# Patient Record
Sex: Female | Born: 1945
Health system: Southern US, Community
[De-identification: ages and names within clinical notes are randomized; demographics above are authoritative.]

## PROBLEM LIST (undated history)

## (undated) DIAGNOSIS — R000 Tachycardia, unspecified: Secondary | ICD-10-CM

## (undated) DIAGNOSIS — K5792 Diverticulitis of intestine, part unspecified, without perforation or abscess without bleeding: Secondary | ICD-10-CM

## (undated) DIAGNOSIS — I4892 Unspecified atrial flutter: Secondary | ICD-10-CM

## (undated) DIAGNOSIS — M810 Age-related osteoporosis without current pathological fracture: Secondary | ICD-10-CM

## (undated) DIAGNOSIS — H9190 Unspecified hearing loss, unspecified ear: Secondary | ICD-10-CM

## (undated) HISTORY — DX: Unspecified atrial flutter: I48.92

## (undated) HISTORY — DX: Unspecified hearing loss, unspecified ear: H91.90

## (undated) HISTORY — DX: Age-related osteoporosis without current pathological fracture: M81.0

## (undated) HISTORY — PX: TUBAL LIGATION: SHX77

## (undated) HISTORY — DX: Diverticulitis of intestine, part unspecified, without perforation or abscess without bleeding: K57.92

## (undated) HISTORY — PX: COLONOSCOPY: SHX174

## (undated) HISTORY — DX: Tachycardia, unspecified: R00.0

---

## 1980-04-11 HISTORY — PX: COSMETIC SURGERY: SHX468

## 1983-04-12 HISTORY — PX: APPENDECTOMY: SHX54

## 1998-06-23 ENCOUNTER — Other Ambulatory Visit: Admission: RE | Admit: 1998-06-23 | Discharge: 1998-06-23 | Payer: Self-pay | Admitting: Obstetrics & Gynecology

## 1999-09-01 ENCOUNTER — Other Ambulatory Visit: Admission: RE | Admit: 1999-09-01 | Discharge: 1999-09-01 | Payer: Self-pay | Admitting: Obstetrics & Gynecology

## 2000-09-06 ENCOUNTER — Other Ambulatory Visit: Admission: RE | Admit: 2000-09-06 | Discharge: 2000-09-06 | Payer: Self-pay | Admitting: Obstetrics & Gynecology

## 2001-10-23 ENCOUNTER — Other Ambulatory Visit: Admission: RE | Admit: 2001-10-23 | Discharge: 2001-10-23 | Payer: Self-pay | Admitting: Obstetrics & Gynecology

## 2002-10-29 ENCOUNTER — Other Ambulatory Visit: Admission: RE | Admit: 2002-10-29 | Discharge: 2002-10-29 | Payer: Self-pay | Admitting: Obstetrics & Gynecology

## 2003-04-12 DIAGNOSIS — M810 Age-related osteoporosis without current pathological fracture: Secondary | ICD-10-CM

## 2003-04-12 HISTORY — DX: Age-related osteoporosis without current pathological fracture: M81.0

## 2003-11-07 ENCOUNTER — Other Ambulatory Visit: Admission: RE | Admit: 2003-11-07 | Discharge: 2003-11-07 | Payer: Self-pay | Admitting: Obstetrics & Gynecology

## 2004-11-16 ENCOUNTER — Other Ambulatory Visit: Admission: RE | Admit: 2004-11-16 | Discharge: 2004-11-16 | Payer: Self-pay | Admitting: Obstetrics & Gynecology

## 2005-12-21 ENCOUNTER — Ambulatory Visit: Payer: Self-pay | Admitting: Cardiology

## 2005-12-30 ENCOUNTER — Ambulatory Visit: Payer: Self-pay

## 2013-08-05 ENCOUNTER — Ambulatory Visit (HOSPITAL_COMMUNITY)
Admission: RE | Admit: 2013-08-05 | Discharge: 2013-08-05 | Disposition: A | Discharge status: Home / Self Care | Payer: Medicare Other | Source: Ambulatory Visit | Attending: Obstetrics & Gynecology | Admitting: Obstetrics & Gynecology

## 2013-08-05 ENCOUNTER — Other Ambulatory Visit (HOSPITAL_COMMUNITY): Payer: Self-pay | Admitting: Obstetrics & Gynecology

## 2013-08-05 ENCOUNTER — Encounter (INDEPENDENT_AMBULATORY_CARE_PROVIDER_SITE_OTHER): Payer: Self-pay

## 2013-08-05 DIAGNOSIS — R059 Cough, unspecified: Secondary | ICD-10-CM

## 2013-08-05 DIAGNOSIS — R05 Cough: Secondary | ICD-10-CM

## 2013-08-05 DIAGNOSIS — R0989 Other specified symptoms and signs involving the circulatory and respiratory systems: Secondary | ICD-10-CM | POA: Insufficient documentation

## 2014-01-14 ENCOUNTER — Encounter: Payer: Self-pay | Admitting: Family Medicine

## 2014-01-14 DIAGNOSIS — H9190 Unspecified hearing loss, unspecified ear: Secondary | ICD-10-CM | POA: Insufficient documentation

## 2014-01-14 DIAGNOSIS — M810 Age-related osteoporosis without current pathological fracture: Secondary | ICD-10-CM | POA: Insufficient documentation

## 2016-07-01 ENCOUNTER — Other Ambulatory Visit: Payer: Self-pay | Admitting: Gastroenterology

## 2016-08-30 ENCOUNTER — Encounter (HOSPITAL_COMMUNITY): Payer: Self-pay

## 2016-08-30 ENCOUNTER — Ambulatory Visit (HOSPITAL_COMMUNITY): Admit: 2016-08-30 | Payer: Medicare Other | Admitting: Gastroenterology

## 2016-08-30 SURGERY — COLONOSCOPY WITH PROPOFOL
Anesthesia: Monitor Anesthesia Care

## 2017-01-12 ENCOUNTER — Institutional Professional Consult (permissible substitution): Payer: No Typology Code available for payment source | Admitting: Neurology

## 2019-06-06 ENCOUNTER — Ambulatory Visit: Payer: Medicare Other | Attending: Internal Medicine

## 2019-06-06 DIAGNOSIS — Z23 Encounter for immunization: Secondary | ICD-10-CM | POA: Insufficient documentation

## 2019-06-06 NOTE — Progress Notes (Signed)
   Covid-19 Vaccination Clinic  Name:  Meagan Stein    MRN: [id number] DOB: 05/14/1945  06/06/2019  [name] was observed post Covid-19 immunization for 15 minutes without incidence. She was provided with Vaccine Information Sheet and instruction to access the V-Safe system.   [name] was instructed to call 911 with any severe reactions post vaccine: Marland Kitchen Difficulty breathing  . Swelling of your face and throat  . A fast heartbeat  . A bad rash all over your body  . Dizziness and weakness    Immunizations Administered    Name Date Dose VIS Date Route   Pfizer COVID-19 Vaccine 06/06/2019  1:52 PM 0.3 mL 03/22/2019 Intramuscular   Manufacturer: [place]   Lot: [id number]   [place]: [id number]

## 2019-07-03 ENCOUNTER — Ambulatory Visit: Payer: Medicare Other | Attending: Internal Medicine

## 2019-07-03 DIAGNOSIS — Z23 Encounter for immunization: Secondary | ICD-10-CM

## 2019-07-03 NOTE — Progress Notes (Signed)
   Covid-19 Vaccination Clinic  Name:  Meagan Stein    MRN: [id number] DOB: 1945-10-10  07/03/2019  [name] was observed post Covid-19 immunization for 15 minutes without incident. She was provided with Vaccine Information Sheet and instruction to access the V-Safe system.   [name] was instructed to call 911 with any severe reactions post vaccine: Marland Kitchen Difficulty breathing  . Swelling of face and throat  . A fast heartbeat  . A bad rash all over body  . Dizziness and weakness   Immunizations Administered    Name Date Dose VIS Date Route   Pfizer COVID-19 Vaccine 07/03/2019  1:09 PM 0.3 mL 03/22/2019 Intramuscular   Manufacturer: [place]   Lot: [id number]   [place]: [id number]

## 2020-09-17 DIAGNOSIS — D0362 Melanoma in situ of left upper limb, including shoulder: Secondary | ICD-10-CM | POA: Diagnosis not present

## 2020-09-17 DIAGNOSIS — D485 Neoplasm of uncertain behavior of skin: Secondary | ICD-10-CM | POA: Diagnosis not present

## 2020-09-17 DIAGNOSIS — D1801 Hemangioma of skin and subcutaneous tissue: Secondary | ICD-10-CM | POA: Diagnosis not present

## 2020-09-17 DIAGNOSIS — D0361 Melanoma in situ of right upper limb, including shoulder: Secondary | ICD-10-CM | POA: Diagnosis not present

## 2020-10-15 DIAGNOSIS — D0361 Melanoma in situ of right upper limb, including shoulder: Secondary | ICD-10-CM | POA: Diagnosis not present

## 2020-11-05 ENCOUNTER — Emergency Department (HOSPITAL_BASED_OUTPATIENT_CLINIC_OR_DEPARTMENT_OTHER)
Admission: EM | Admit: 2020-11-05 | Discharge: 2020-11-05 | Disposition: A | Discharge status: Home / Self Care | Payer: Medicare HMO | Attending: Emergency Medicine | Admitting: Emergency Medicine

## 2020-11-05 ENCOUNTER — Emergency Department (HOSPITAL_BASED_OUTPATIENT_CLINIC_OR_DEPARTMENT_OTHER): Payer: Medicare HMO

## 2020-11-05 ENCOUNTER — Emergency Department (HOSPITAL_BASED_OUTPATIENT_CLINIC_OR_DEPARTMENT_OTHER): Payer: Medicare HMO | Admitting: Radiology

## 2020-11-05 ENCOUNTER — Encounter (HOSPITAL_BASED_OUTPATIENT_CLINIC_OR_DEPARTMENT_OTHER): Payer: Self-pay | Admitting: Emergency Medicine

## 2020-11-05 ENCOUNTER — Other Ambulatory Visit: Payer: Self-pay

## 2020-11-05 DIAGNOSIS — R140 Abdominal distension (gaseous): Secondary | ICD-10-CM | POA: Insufficient documentation

## 2020-11-05 DIAGNOSIS — R350 Frequency of micturition: Secondary | ICD-10-CM | POA: Diagnosis not present

## 2020-11-05 DIAGNOSIS — R000 Tachycardia, unspecified: Secondary | ICD-10-CM | POA: Insufficient documentation

## 2020-11-05 DIAGNOSIS — I4892 Unspecified atrial flutter: Secondary | ICD-10-CM | POA: Insufficient documentation

## 2020-11-05 DIAGNOSIS — K5792 Diverticulitis of intestine, part unspecified, without perforation or abscess without bleeding: Secondary | ICD-10-CM | POA: Diagnosis not present

## 2020-11-05 DIAGNOSIS — R1030 Lower abdominal pain, unspecified: Secondary | ICD-10-CM | POA: Insufficient documentation

## 2020-11-05 DIAGNOSIS — I471 Supraventricular tachycardia: Secondary | ICD-10-CM | POA: Diagnosis not present

## 2020-11-05 DIAGNOSIS — R109 Unspecified abdominal pain: Secondary | ICD-10-CM | POA: Diagnosis not present

## 2020-11-05 DIAGNOSIS — Z7982 Long term (current) use of aspirin: Secondary | ICD-10-CM | POA: Diagnosis not present

## 2020-11-05 DIAGNOSIS — I480 Paroxysmal atrial fibrillation: Secondary | ICD-10-CM | POA: Diagnosis not present

## 2020-11-05 LAB — URINALYSIS, ROUTINE W REFLEX MICROSCOPIC
Bilirubin Urine: NEGATIVE
Glucose, UA: NEGATIVE mg/dL
Ketones, ur: >80 mg/dL — AB
Leukocytes,Ua: NEGATIVE
Nitrite: NEGATIVE
Protein, ur: 30 mg/dL — AB
Specific Gravity, Urine: 1.033 — ABNORMAL HIGH (ref 1.005–1.030)
pH: 5.5 (ref 5.0–8.0)

## 2020-11-05 LAB — CBC
HCT: 43.4 % (ref 36.0–46.0)
Hemoglobin: 14.1 g/dL (ref 12.0–15.0)
MCH: 29.6 pg (ref 26.0–34.0)
MCHC: 32.5 g/dL (ref 30.0–36.0)
MCV: 91 fL (ref 80.0–100.0)
Platelets: 292 10*3/uL (ref 150–400)
RBC: 4.77 MIL/uL (ref 3.87–5.11)
RDW: 13 % (ref 11.5–15.5)
WBC: 12.2 10*3/uL — ABNORMAL HIGH (ref 4.0–10.5)
nRBC: 0 % (ref 0.0–0.2)

## 2020-11-05 LAB — TSH: TSH: 1.802 u[IU]/mL (ref 0.350–4.500)

## 2020-11-05 LAB — BASIC METABOLIC PANEL
Anion gap: 12 (ref 5–15)
BUN: 17 mg/dL (ref 8–23)
CO2: 23 mmol/L (ref 22–32)
Calcium: 9.2 mg/dL (ref 8.9–10.3)
Chloride: 104 mmol/L (ref 98–111)
Creatinine, Ser: 0.76 mg/dL (ref 0.44–1.00)
GFR, Estimated: >60 mL/min (ref >60)
Glucose, Bld: 97 mg/dL (ref 70–99)
Potassium: 3.9 mmol/L (ref 3.5–5.1)
Sodium: 139 mmol/L (ref 135–145)

## 2020-11-05 LAB — TROPONIN I (HIGH SENSITIVITY)
Troponin I (High Sensitivity): 4 ng/L (ref <18)
Troponin I (High Sensitivity): 5 ng/L (ref <18)

## 2020-11-05 LAB — MAGNESIUM: Magnesium: 2 mg/dL (ref 1.7–2.4)

## 2020-11-05 MED ORDER — IOHEXOL 300 MG/ML  SOLN
75.0000 mL | Freq: Once | INTRAMUSCULAR | Status: AC | PRN
  Administered 2020-11-05: 75 mL via INTRAVENOUS

## 2020-11-05 MED ORDER — METOPROLOL TARTRATE 5 MG/5ML IV SOLN
5.0000 mg | Freq: Once | INTRAVENOUS | Status: DC
Start: 2020-11-05 — End: 2020-11-05
  Filled 2020-11-05: qty 5

## 2020-11-05 MED ORDER — SODIUM CHLORIDE 0.9 % IV SOLN: Freq: Once | INTRAVENOUS | Status: AC

## 2020-11-05 MED ORDER — AMOXICILLIN-POT CLAVULANATE 875-125 MG PO TABS: 1.0000 | ORAL_TABLET | Freq: Two times a day (BID) | ORAL | 0 refills | Status: DC

## 2020-11-05 NOTE — ED Notes (Signed)
Pt transferred to [facility] then back to bed. At this time HR has decreased to 100

## 2020-11-05 NOTE — ED Provider Notes (Signed)
CT scan showed uncomplicated diverticulitis.  Probably the explanation for patient's abdominal pain.  No other acute findings.  Patient did arrive here with rapid heart rate probably atrial flutter.  Patient without any knowledge that the heart rate was fast.  She converted and has been in a sinus rhythm around heart rate around 100 since.  Patient was treated with Augmentin for the diverticulitis.  Patient will be given referral to cardiology for further work-up of the palpitations.  Patient currently nontoxic no acute distress.  No significant arrhythmias.   Fredia Sorrow, MD 11/05/20 [phone]

## 2020-11-05 NOTE — ED Provider Notes (Signed)
[facility] EMERGENCY DEPT Provider Note   CSN: [id number] Arrival date & time: 11/05/20  1139     History No chief complaint on file.   Meagan Stein is a 75 y.o. female.  HPI Patient reports lower abdominal pain for about 2 or 3 days.  She reports she is felt a bloating and distention sensation lower abdomen.  Is been particularly worse after eating on the first day.  She reports is an aching quality.  Starting last night and yesterday if she was having urinary frequency.  She reports she got up multiple times during the night and felt like she had to urinate but could not go.  This morning she has put out small amounts of urine.  It is not burning when she goes.  She went to urgent care for evaluation and there was found to have high heart rate.  Patient was referred to the emergency department for further evaluation.  Patient denies any sensation of fluttering or skipping of her heart.  She does not feel any palpitations.  This morning she was slightly lightheaded but she attributed to her lower abdominal pain.    Past Medical History:  Diagnosis Date   Atrial flutter ([place])    Hearing loss    Osteoporosis 2005    Patient Active Problem List   Diagnosis Date Noted   Osteoporosis    Hearing loss     Past Surgical History:  Procedure Laterality Date   APPENDECTOMY  1985   COSMETIC SURGERY  1982   breast implants   TUBAL LIGATION       OB History   No obstetric history on file.     Family History  Problem Relation Age of Onset   Cancer Sister    Cancer Brother    Cancer Sister     Social History   Tobacco Use   Smoking status: Never   Smokeless tobacco: Never  Substance Use Topics   Alcohol use: No   Drug use: No    Home Medications Prior to Admission medications   Medication Sig Start Date End Date Taking? Authorizing Provider  alendronate (FOSAMAX) 70 MG tablet Take 70 mg by mouth once a week. Take with a full glass of water on an empty  stomach.   Yes [provider]  aspirin 81 MG tablet Take 81 mg by mouth daily.   Yes [provider]  ALPRAZolam (XANAX) 0.25 MG tablet Take 0.25 mg by mouth at bedtime.    [provider]  calcium carbonate (OS-CAL) 600 MG TABS tablet Take 600 mg by mouth daily with breakfast.    [provider]  Multiple Vitamin (MULTIVITAMIN) tablet Take 1 tablet by mouth daily.    [provider]    Allergies    Patient has no known allergies.  Review of Systems   Review of Systems 10 systems reviewed negative except as per HPI Physical Exam Updated Vital Signs BP 111/72   Pulse (!) 140   Temp 98.4 F (36.9 C) (Oral)   Resp 17   Ht '5\' 5"'$  (1.651 m)   Wt 67.1 kg   SpO2 99%   BMI 24.63 kg/m   Physical Exam Constitutional:      Comments: Alert, nontoxic well in appearance.  HENT:     Head: Normocephalic and atraumatic.     Mouth/Throat:     Pharynx: Oropharynx is clear.  Eyes:     Extraocular Movements: Extraocular movements intact.  Cardiovascular:  Comments: Extreme tachycardia. Pulmonary:     Effort: Pulmonary effort is normal.     Breath sounds: Normal breath sounds.  Abdominal:     Comments: Patient has moderate lower abdominal discomfort.  Some distention and fullness of the suprapubic area.  No guarding.  No masses in the femoral region  Musculoskeletal:        General: No swelling or tenderness. Normal range of motion.     Right lower leg: No edema.     Left lower leg: No edema.  Skin:    General: Skin is warm and dry.  Neurological:     General: No focal deficit present.     Mental Status: She is oriented to person, place, and time.     Motor: No weakness.     Coordination: Coordination normal.  Psychiatric:        Mood and Affect: Mood normal.    ED Results / Procedures / Treatments   Labs (all labs ordered are listed, but only abnormal results are displayed) Labs Reviewed  CBC - Abnormal; Notable for the following  components:      Result Value   WBC 12.2 (*)    All other components within normal limits  URINALYSIS, ROUTINE W REFLEX MICROSCOPIC - Abnormal; Notable for the following components:   Specific Gravity, Urine 1.033 (*)    Hgb urine dipstick LARGE (*)    Ketones, ur >80 (*)    Protein, ur 30 (*)    All other components within normal limits  BASIC METABOLIC PANEL  MAGNESIUM  TSH  TROPONIN I (HIGH SENSITIVITY)  TROPONIN I (HIGH SENSITIVITY)    EKG EKG Interpretation  Date/Time:  Thursday November 05 2020 12:06:04 EDT Ventricular Rate:  147 PR Interval:    QRS Duration: 72 QT Interval:  296 QTC Calculation: 463 R Axis:   54 Text Interpretation: Supraventricular tachycardia Nonspecific ST abnormality Abnormal ECG agree, no old comparison Confirmed by Charlesetta Shanks [id number]) on 11/05/2020 2:01:51 PM  Radiology DG Chest 2 View  Result Date: 11/05/2020 CLINICAL DATA:  Tachycardia EXAM: CHEST - 2 VIEW COMPARISON:  08/05/2013 FINDINGS: The heart size and mediastinal contours are within normal limits. Both lungs are clear. The visualized skeletal structures are unremarkable. IMPRESSION: No active cardiopulmonary disease. Electronically Signed   By: Franchot Gallo M.D.   On: 11/05/2020 12:51    Procedures Procedures   Medications Ordered in ED Medications  metoprolol tartrate (LOPRESSOR) injection 5 mg (has no administration in time range)  0.9 %  sodium chloride infusion ( Intravenous New Bag/Given 11/05/20 1426)  iohexol (OMNIPAQUE) 300 MG/ML solution 75 mL (75 mLs Intravenous Contrast Given 11/05/20 1525)    ED Course  I have reviewed the triage vital signs and the nursing notes.  Pertinent labs & imaging results that were available during my care of the patient were reviewed by me and considered in my medical decision making (see chart for details).    MDM Rules/Calculators/A&P                           . Patient is clinically well in appearance.  She has been suffering from  lower abdominal pain and distention for couple of days with difficulty passing urine overnight.  She was seen in urgent care and found to have high heart rate.  Review of EMR indicates patient does have a prior diagnosis of atrial flutter.  Patient reports that she is never had symptoms with this  and is not treated.  She was completely asymptomatic.  She may have sporadic episodes that are not detected.  Patient spontaneously converted back to a sinus rhythm without intervention at this time plan will be to proceed with CT scan for lower abdominal pain which was her chief complaint earlier today.  Pending scan results, Dr. Helane Gunther to review for final disposition. Final Clinical Impression(s) / ED Diagnoses Final diagnoses:  Lower abdominal pain  Paroxysmal atrial flutter ([place])    Rx / DC Orders ED Discharge Orders     None        Charlesetta Shanks, MD 11/05/20 1537

## 2020-11-05 NOTE — Discharge Instructions (Addendum)
CT scan showed uncomplicated diverticulitis but this needs to be treated with an antibiotic for 7 days.  Take as directed.  You should improve over the next 2 days.  Return for any new or worse symptoms.  Also we had the fast heart rate today would recommend making an appointment follow-up with cardiology for further evaluation of that.

## 2020-11-12 DIAGNOSIS — Z803 Family history of malignant neoplasm of breast: Secondary | ICD-10-CM | POA: Diagnosis not present

## 2020-11-12 DIAGNOSIS — Z1231 Encounter for screening mammogram for malignant neoplasm of breast: Secondary | ICD-10-CM | POA: Diagnosis not present

## 2020-11-19 DIAGNOSIS — Z6823 Body mass index (BMI) 23.0-23.9, adult: Secondary | ICD-10-CM | POA: Diagnosis not present

## 2020-11-19 DIAGNOSIS — Z01419 Encounter for gynecological examination (general) (routine) without abnormal findings: Secondary | ICD-10-CM | POA: Diagnosis not present

## 2020-11-19 DIAGNOSIS — Z124 Encounter for screening for malignant neoplasm of cervix: Secondary | ICD-10-CM | POA: Diagnosis not present

## 2020-12-21 DIAGNOSIS — M85852 Other specified disorders of bone density and structure, left thigh: Secondary | ICD-10-CM | POA: Diagnosis not present

## 2020-12-21 DIAGNOSIS — Z780 Asymptomatic menopausal state: Secondary | ICD-10-CM | POA: Diagnosis not present

## 2020-12-21 DIAGNOSIS — M810 Age-related osteoporosis without current pathological fracture: Secondary | ICD-10-CM | POA: Diagnosis not present

## 2020-12-21 DIAGNOSIS — M85851 Other specified disorders of bone density and structure, right thigh: Secondary | ICD-10-CM | POA: Diagnosis not present

## 2021-01-06 DIAGNOSIS — M810 Age-related osteoporosis without current pathological fracture: Secondary | ICD-10-CM | POA: Diagnosis not present

## 2021-01-06 DIAGNOSIS — Z0000 Encounter for general adult medical examination without abnormal findings: Secondary | ICD-10-CM | POA: Diagnosis not present

## 2021-01-06 DIAGNOSIS — Z1339 Encounter for screening examination for other mental health and behavioral disorders: Secondary | ICD-10-CM | POA: Diagnosis not present

## 2021-01-06 DIAGNOSIS — Z1331 Encounter for screening for depression: Secondary | ICD-10-CM | POA: Diagnosis not present

## 2021-01-06 DIAGNOSIS — L309 Dermatitis, unspecified: Secondary | ICD-10-CM | POA: Diagnosis not present

## 2021-01-12 ENCOUNTER — Other Ambulatory Visit: Payer: Self-pay

## 2021-01-12 ENCOUNTER — Ambulatory Visit (HOSPITAL_BASED_OUTPATIENT_CLINIC_OR_DEPARTMENT_OTHER): Payer: Medicare HMO | Admitting: Cardiology

## 2021-01-12 VITALS — BP 116/66 | HR 80 | Ht 65.0 in | Wt 143.6 lb

## 2021-01-12 DIAGNOSIS — I4892 Unspecified atrial flutter: Secondary | ICD-10-CM | POA: Diagnosis not present

## 2021-01-12 DIAGNOSIS — H9193 Unspecified hearing loss, bilateral: Secondary | ICD-10-CM

## 2021-01-12 DIAGNOSIS — I484 Atypical atrial flutter: Secondary | ICD-10-CM

## 2021-01-12 NOTE — Progress Notes (Signed)
Cardiology Office Note:    Date:  01/12/2021   ID:  Meagan, Stein [birth year], MRN [id number]  PCP:  Velna Hatchet, MD   [facility] Providers Cardiologist:  None     Referring MD: Orlena Sheldon, PA-C   History of Present Illness:    Meagan Stein is a 75 y.o. female here for the evaluation of palpitations, recent ED visit, SVT, possible atrial flutter.  On 11/05/2020 she presented to the ED complaining of lower abdominal pain for the previous 2-3 days and difficulty passing urine. She was found to have a high heart rate, possibly atrial flutter but she denied feeling any palpitations. CT scan showed uncomplicated diverticulitis.  Today: Overall, she appears well, and has recovered well. On 7/28 she felt a little lightheaded, but did not feel any palpitations.  She reports that she went to urgent care, and was found to have elevated heart rate. She was then sent to the ED and told to see a cardiologist. Of note, her pain and diverticulitis have not recurred.  She is not a smoker.  She has 2 siblings who died of cancer, and 1 brother with cancer. She endorses a hx of skin cancer on her right UE.  She denies any chest pain, or shortness of breath. No headaches, syncope, orthopnea, or PND. Also has no lower extremity edema or exertional symptoms.  Past Medical History:  Diagnosis Date   Atrial flutter ([place])    Hearing loss    Osteoporosis 2005    Past Surgical History:  Procedure Laterality Date   APPENDECTOMY  1985   COSMETIC SURGERY  1982   breast implants   TUBAL LIGATION      Current Medications: Current Meds  Medication Sig   alendronate (FOSAMAX) 70 MG tablet Take 70 mg by mouth once a week. Take with a full glass of water on an empty stomach.   aspirin 81 MG tablet Take 81 mg by mouth daily.     Allergies:   Patient has no known allergies.   Social History   Socioeconomic History   Marital status: Married    Spouse name: Not on file   Number of  children: Not on file   Years of education: Not on file   Highest education level: Not on file  Occupational History   Not on file  Tobacco Use   Smoking status: Never   Smokeless tobacco: Never  Substance and Sexual Activity   Alcohol use: No   Drug use: No   Sexual activity: Yes    Birth control/protection: None  Other Topics Concern   Not on file  Social History Narrative   Not on file   Social Determinants of Health   Financial Resource Strain: Not on file  Food Insecurity: Not on file  Transportation Needs: Not on file  Physical Activity: Not on file  Stress: Not on file  Social Connections: Not on file     Family History: The patient's family history includes Cancer in her brother, sister, and sister.  ROS:   Please see the history of present illness.    All other systems reviewed and are negative.  EKGs/Labs/Other Studies Reviewed:    The following studies were reviewed today: No prior cardiovascular studies available.   EKG:  EKG is personally reviewed and interpreted. 01/12/2021: Sinus rhythm. Rate 80 bpm. 11/05/2020 (ED): Supraventricular tachycardia. Rate 147 bpm. Nonspecific ST abnormality.  Recent Labs: 11/05/2020: BUN 17; Creatinine, Ser 0.76; Hemoglobin 14.1; Magnesium 2.0;  Platelets 292; Potassium 3.9; Sodium 139; TSH 1.802   Recent Lipid Panel No results found for: CHOL, TRIG, HDL, CHOLHDL, VLDL, LDLCALC, LDLDIRECT   Risk Assessment/Calculations:          Physical Exam:    VS:  BP 116/66   Pulse 80   Ht 5\' 5"  (1.651 m)   Wt 143 lb 9.6 oz (65.1 kg)   BMI 23.90 kg/m     Wt Readings from Last 3 Encounters:  01/12/21 143 lb 9.6 oz (65.1 kg)  11/05/20 148 lb (67.1 kg)     GEN: Well nourished, well developed in no acute distress HEENT: Normal NECK: No JVD; No carotid bruits LYMPHATICS: No lymphadenopathy CARDIAC: RRR, no murmurs, rubs, gallops RESPIRATORY:  Clear to auscultation without rales, wheezing or rhonchi  ABDOMEN: Soft,  non-tender, non-distended MUSCULOSKELETAL:  No edema; No deformity  SKIN: Warm and dry NEUROLOGIC:  Alert and oriented x 3 PSYCHIATRIC:  Normal affect   ASSESSMENT:    1. Atrial flutter, unspecified type ([place])   2. Atypical atrial flutter (H[facility])   3. Bilateral hearing loss, unspecified hearing loss type    PLAN:    In order of problems listed above: Atrial flutter (HCC) Paroxysmal atrial flutter, atypical pattern noted on ECG during her episode of acute diverticulitis.  Thankfully, she auto converted and we have not seen any further episodes.  This episode was likely triggered by her underlying infection.  I did relate to her the risks of atrial flutter which are akin to atrial fibrillation, stroke.  If this were to return, anticoagulation would be warranted.  For now, I will check an echocardiogram to ensure proper structure and function of her heart.  Okay for her to continue with 81 mg of aspirin.  She will let us know if tachycardia returns.  Hearing loss Challenging for her to hear conversations.        Follow-up: 1 year.  Medication Adjustments/Labs and Tests Ordered: Current medicines are reviewed at length with the patient today.  Concerns regarding medicines are outlined above.  Orders Placed This Encounter  Procedures   EKG 12-Lead   ECHOCARDIOGRAM COMPLETE     No orders of the defined types were placed in this encounter.   Patient Instructions  Medication Instructions:  Continue current medications as listed.  *If you need a refill on your cardiac medications before your next appointment, please call your pharmacy*  Testing/Procedures: Your physician has requested that you have an echocardiogram. Echocardiography is a painless test that uses sound waves to create images of your heart. It provides your doctor with information about the size and shape of your heart and how well your heart's chambers and valves are working. This procedure takes approximately one  hour. There are no restrictions for this procedure.  Follow-Up: At [facility], you and your health needs are our priority.  As part of our continuing mission to provide you with exceptional heart care, we have created designated Provider Care Teams.  These Care Teams include your primary Cardiologist (physician) and Advanced Practice Providers (APPs -  Physician Assistants and Nurse Practitioners) who all work together to provide you with the care you need, when you need it.  We recommend signing up for the patient portal called "MyChart".  Sign up information is provided on this After Visit Summary.  MyChart is used to connect with patients for Virtual Visits (Telemedicine).  Patients are able to view lab/test results, encounter notes, upcoming appointments, etc.  Non-urgent messages can be  sent to your provider as well.   To learn more about what you can do with MyChart, go to [email or web address].    Your next appointment:   1 year(s)  The format for your next appointment:   In Person  Provider:   Candee Furbish, MD   Thank you for choosing [facility]!!     I,Mathew Stumpf,acting as a scribe for Candee Furbish, MD.,have documented all relevant documentation on the behalf of Candee Furbish, MD,as directed by  Candee Furbish, MD while in the presence of Candee Furbish, MD.  I, Candee Furbish, MD, have reviewed all documentation for this visit. The documentation on 01/12/21 for the exam, diagnosis, procedures, and orders are all accurate and complete.   Signed, Candee Furbish, MD  01/12/2021 4:01 PM    [facility]

## 2021-01-12 NOTE — Patient Instructions (Signed)
Medication Instructions:  Continue current medications as listed.  *If you need a refill on your cardiac medications before your next appointment, please call your pharmacy*  Testing/Procedures: Your physician has requested that you have an echocardiogram. Echocardiography is a painless test that uses sound waves to create images of your heart. It provides your doctor with information about the size and shape of your heart and how well your heart's chambers and valves are working. This procedure takes approximately one hour. There are no restrictions for this procedure.  Follow-Up: At [facility], you and your health needs are our priority.  As part of our continuing mission to provide you with exceptional heart care, we have created designated Provider Care Teams.  These Care Teams include your primary Cardiologist (physician) and Advanced Practice Providers (APPs -  Physician Assistants and Nurse Practitioners) who all work together to provide you with the care you need, when you need it.  We recommend signing up for the patient portal called "MyChart".  Sign up information is provided on this After Visit Summary.  MyChart is used to connect with patients for Virtual Visits (Telemedicine).  Patients are able to view lab/test results, encounter notes, upcoming appointments, etc.  Non-urgent messages can be sent to your provider as well.   To learn more about what you can do with MyChart, go to [email or web address].    Your next appointment:   1 year(s)  The format for your next appointment:   In Person  Provider:   Candee Furbish, MD   Thank you for choosing [facility]!!

## 2021-01-12 NOTE — Assessment & Plan Note (Signed)
Paroxysmal atrial flutter, atypical pattern noted on ECG during her episode of acute diverticulitis.  Thankfully, she auto converted and we have not seen any further episodes.  This episode was likely triggered by her underlying infection.  I did relate to her the risks of atrial flutter which are akin to atrial fibrillation, stroke.  If this were to return, anticoagulation would be warranted.  For now, I will check an echocardiogram to ensure proper structure and function of her heart.  Okay for her to continue with 81 mg of aspirin.  She will let us know if tachycardia returns.

## 2021-01-12 NOTE — Assessment & Plan Note (Signed)
Challenging for her to hear conversations.

## 2021-01-19 ENCOUNTER — Ambulatory Visit (INDEPENDENT_AMBULATORY_CARE_PROVIDER_SITE_OTHER): Payer: Medicare HMO

## 2021-01-19 ENCOUNTER — Other Ambulatory Visit: Payer: Self-pay

## 2021-01-19 DIAGNOSIS — I4892 Unspecified atrial flutter: Secondary | ICD-10-CM | POA: Diagnosis not present

## 2021-01-19 LAB — ECHOCARDIOGRAM COMPLETE
AR max vel: 1.51 cm2
AV Area VTI: 1.4 cm2
AV Area mean vel: 1.38 cm2
AV Mean grad: 5 mmHg
AV Peak grad: 9.4 mmHg
Ao pk vel: 1.53 m/s
Area-P 1/2: 5.2 cm2
Calc EF: 75 %
S' Lateral: 2.96 cm
Single Plane A2C EF: 80 %
Single Plane A4C EF: 66.5 %

## 2021-03-17 DIAGNOSIS — D1801 Hemangioma of skin and subcutaneous tissue: Secondary | ICD-10-CM | POA: Diagnosis not present

## 2021-03-17 DIAGNOSIS — Z85820 Personal history of malignant melanoma of skin: Secondary | ICD-10-CM | POA: Diagnosis not present

## 2021-03-17 DIAGNOSIS — D485 Neoplasm of uncertain behavior of skin: Secondary | ICD-10-CM | POA: Diagnosis not present

## 2021-03-17 DIAGNOSIS — D1721 Benign lipomatous neoplasm of skin and subcutaneous tissue of right arm: Secondary | ICD-10-CM | POA: Diagnosis not present

## 2021-03-17 DIAGNOSIS — L821 Other seborrheic keratosis: Secondary | ICD-10-CM | POA: Diagnosis not present

## 2021-09-15 DIAGNOSIS — L821 Other seborrheic keratosis: Secondary | ICD-10-CM | POA: Diagnosis not present

## 2021-09-15 DIAGNOSIS — D1721 Benign lipomatous neoplasm of skin and subcutaneous tissue of right arm: Secondary | ICD-10-CM | POA: Diagnosis not present

## 2021-09-15 DIAGNOSIS — D1801 Hemangioma of skin and subcutaneous tissue: Secondary | ICD-10-CM | POA: Diagnosis not present

## 2021-09-15 DIAGNOSIS — Z85820 Personal history of malignant melanoma of skin: Secondary | ICD-10-CM | POA: Diagnosis not present

## 2021-10-20 DIAGNOSIS — R7989 Other specified abnormal findings of blood chemistry: Secondary | ICD-10-CM | POA: Diagnosis not present

## 2021-10-20 DIAGNOSIS — Z0000 Encounter for general adult medical examination without abnormal findings: Secondary | ICD-10-CM | POA: Diagnosis not present

## 2021-10-27 DIAGNOSIS — H9190 Unspecified hearing loss, unspecified ear: Secondary | ICD-10-CM | POA: Diagnosis not present

## 2021-10-27 DIAGNOSIS — Z0000 Encounter for general adult medical examination without abnormal findings: Secondary | ICD-10-CM | POA: Diagnosis not present

## 2021-10-27 DIAGNOSIS — Z1389 Encounter for screening for other disorder: Secondary | ICD-10-CM | POA: Diagnosis not present

## 2021-10-27 DIAGNOSIS — M810 Age-related osteoporosis without current pathological fracture: Secondary | ICD-10-CM | POA: Diagnosis not present

## 2021-10-27 DIAGNOSIS — Z1331 Encounter for screening for depression: Secondary | ICD-10-CM | POA: Diagnosis not present

## 2021-10-27 DIAGNOSIS — L989 Disorder of the skin and subcutaneous tissue, unspecified: Secondary | ICD-10-CM | POA: Diagnosis not present

## 2021-10-27 DIAGNOSIS — L309 Dermatitis, unspecified: Secondary | ICD-10-CM | POA: Diagnosis not present

## 2021-11-18 DIAGNOSIS — Z1231 Encounter for screening mammogram for malignant neoplasm of breast: Secondary | ICD-10-CM | POA: Diagnosis not present

## 2021-11-22 DIAGNOSIS — N905 Atrophy of vulva: Secondary | ICD-10-CM | POA: Diagnosis not present

## 2021-11-22 DIAGNOSIS — N959 Unspecified menopausal and perimenopausal disorder: Secondary | ICD-10-CM | POA: Diagnosis not present

## 2021-11-22 DIAGNOSIS — Z01419 Encounter for gynecological examination (general) (routine) without abnormal findings: Secondary | ICD-10-CM | POA: Diagnosis not present

## 2021-11-22 DIAGNOSIS — Z6827 Body mass index (BMI) 27.0-27.9, adult: Secondary | ICD-10-CM | POA: Diagnosis not present

## 2021-11-22 DIAGNOSIS — M810 Age-related osteoporosis without current pathological fracture: Secondary | ICD-10-CM | POA: Diagnosis not present

## 2021-11-25 ENCOUNTER — Encounter: Payer: Self-pay | Admitting: Gastroenterology

## 2021-11-29 DIAGNOSIS — N6321 Unspecified lump in the left breast, upper outer quadrant: Secondary | ICD-10-CM | POA: Diagnosis not present

## 2021-11-29 DIAGNOSIS — R928 Other abnormal and inconclusive findings on diagnostic imaging of breast: Secondary | ICD-10-CM | POA: Diagnosis not present

## 2021-11-29 DIAGNOSIS — R922 Inconclusive mammogram: Secondary | ICD-10-CM | POA: Diagnosis not present

## 2021-11-29 DIAGNOSIS — N6002 Solitary cyst of left breast: Secondary | ICD-10-CM | POA: Diagnosis not present

## 2021-12-16 ENCOUNTER — Ambulatory Visit (AMBULATORY_SURGERY_CENTER): Payer: Self-pay | Admitting: *Deleted

## 2021-12-16 VITALS — Ht 65.0 in | Wt 139.0 lb

## 2021-12-16 DIAGNOSIS — Z800 Family history of malignant neoplasm of digestive organs: Secondary | ICD-10-CM

## 2021-12-16 NOTE — Progress Notes (Signed)
No egg or soy allergy known to patient  No issues known to pt with past sedation with any surgeries or procedures Patient denies ever being told they had issues or difficulty with intubation  No FH of Malignant Hyperthermia Pt is not on diet pills Pt is not on  home 02  Pt is not on blood thinners  Pt denies issues with constipation  No A fib H/O A flutter Have any cardiac testing pending--NO Pt instructed to use Singlecare.com or GoodRx for a price reduction on prep    PT. AND HUSBAND CURRENTLY HAVE COVID,FEELING BETTER DURING PRE-VISIT TODAY BUT WILL HAVE CHANGES OCCUR.  Sample sheet of over the counter items to purchase for prep mailed with packet.  Alternative prep discussed with in detailed and she decided on miralax prep.

## 2021-12-17 ENCOUNTER — Encounter: Payer: Self-pay | Admitting: Gastroenterology

## 2021-12-31 ENCOUNTER — Telehealth: Payer: Self-pay | Admitting: Gastroenterology

## 2021-12-31 NOTE — Telephone Encounter (Signed)
PT has a colonoscopy on Monday and ate corn and beans 2 days ago. She wants to know if she's ok to still have the procedure done. Please advise.

## 2021-12-31 NOTE — Telephone Encounter (Signed)
Returned call to patient. I informed patient that she can proceed as scheduled. Pt advised to follow diet as outlined moving forward. Pt verbalized understanding and had no concerns at the end of the call.

## 2022-01-03 ENCOUNTER — Encounter: Payer: Self-pay | Admitting: Gastroenterology

## 2022-01-03 ENCOUNTER — Ambulatory Visit (AMBULATORY_SURGERY_CENTER): Payer: Medicare HMO | Admitting: Gastroenterology

## 2022-01-03 VITALS — BP 128/61 | HR 83 | Temp 98.8°F | Resp 12 | Ht 64.5 in | Wt 139.0 lb

## 2022-01-03 DIAGNOSIS — Z800 Family history of malignant neoplasm of digestive organs: Secondary | ICD-10-CM | POA: Diagnosis not present

## 2022-01-03 DIAGNOSIS — D123 Benign neoplasm of transverse colon: Secondary | ICD-10-CM | POA: Diagnosis not present

## 2022-01-03 DIAGNOSIS — Z1211 Encounter for screening for malignant neoplasm of colon: Secondary | ICD-10-CM

## 2022-01-03 MED ORDER — SODIUM CHLORIDE 0.9 % IV SOLN
500.0000 mL | Freq: Once | INTRAVENOUS | Status: DC
Start: 2022-01-03 — End: 2022-01-03

## 2022-01-03 NOTE — Progress Notes (Signed)
Vital signs checked by:JF  The patient states no changes in medical or surgical history since pre-visit screening on 12/16/21.

## 2022-01-03 NOTE — Patient Instructions (Signed)
   Handout on polyps & diverticulosis given to you today   Await pathology results on polyps removed     YOU HAD AN ENDOSCOPIC PROCEDURE TODAY AT THE [facility]:   Refer to the procedure report that was given to you for any specific questions about what was found during the examination.  If the procedure report does not answer your questions, please call your gastroenterologist to clarify.  If you requested that your care partner not be given the details of your procedure findings, then the procedure report has been included in a sealed envelope for you to review at your convenience later.  YOU SHOULD EXPECT: Some feelings of bloating in the abdomen. Passage of more gas than usual.  Walking can help get rid of the air that was put into your GI tract during the procedure and reduce the bloating. If you had a lower endoscopy (such as a colonoscopy or flexible sigmoidoscopy) you may notice spotting of blood in your stool or on the toilet paper. If you underwent a bowel prep for your procedure, you may not have a normal bowel movement for a few days.  Please Note:  You might notice some irritation and congestion in your nose or some drainage.  This is from the oxygen used during your procedure.  There is no need for concern and it should clear up in a day or so.  SYMPTOMS TO REPORT IMMEDIATELY:  Following lower endoscopy (colonoscopy or flexible sigmoidoscopy):  Excessive amounts of blood in the stool  Significant tenderness or worsening of abdominal pains  Swelling of the abdomen that is new, acute  Fever of 100F or higher  For urgent or emergent issues, a gastroenterologist can be reached at any hour by calling [phone]. Do not use MyChart messaging for urgent concerns.    DIET:  We do recommend a small meal at first, but then you may proceed to your regular diet.  Drink plenty of fluids but you should avoid alcoholic beverages for 24 hours.  ACTIVITY:  You should  plan to take it easy for the rest of today and you should NOT DRIVE or use heavy machinery until tomorrow (because of the sedation medicines used during the test).    FOLLOW UP: Our staff will call the number listed on your records the next business day following your procedure.  We will call around 7:15- 8:00 am to check on you and address any questions or concerns that you may have regarding the information given to you following your procedure. If we do not reach you, we will leave a message.     If any biopsies were taken you will be contacted by phone or by letter within the next 1-3 weeks.  Please call us at [phone] if you have not heard about the biopsies in 3 weeks.    SIGNATURES/CONFIDENTIALITY: You and/or your care partner have signed paperwork which will be entered into your electronic medical record.  These signatures attest to the fact that that the information above on your After Visit Summary has been reviewed and is understood.  Full responsibility of the confidentiality of this discharge information lies with you and/or your care-partner. 

## 2022-01-03 NOTE — Op Note (Signed)
[facility] Patient Name: Meagan Stein Procedure Date: 01/03/2022 12:15 PM MRN: [id number] Endoscopist: Nicki Reaper E. Candis Schatz , MD Age: 76 Referring MD:  Date of Birth: Dec 13, 1945 Gender: Female Account #: [id number] Procedure:                Colonoscopy Indications:              Screening in patient at increased risk: Colorectal                            cancer in sister 78 or older Medicines:                Monitored Anesthesia Care Procedure:                Pre-Anesthesia Assessment:                           - Prior to the procedure, a History and Physical                            was performed, and patient medications and                            allergies were reviewed. The patient's tolerance of                            previous anesthesia was also reviewed. The risks                            and benefits of the procedure and the sedation                            options and risks were discussed with the patient.                            All questions were answered, and informed consent                            was obtained. Prior Anticoagulants: The patient has                            taken no previous anticoagulant or antiplatelet                            agents except for aspirin. ASA Grade Assessment: II                            - A patient with mild systemic disease. After                            reviewing the risks and benefits, the patient was                            deemed in satisfactory condition to undergo the  procedure.                           After obtaining informed consent, the colonoscope                            was passed under direct vision. Throughout the                            procedure, the patient's blood pressure, pulse, and                            oxygen saturations were monitored continuously. The                            Olympus PCF-H190DL (#[id number]) Colonoscope was                             introduced through the anus and advanced to the the                            cecum, identified by appendiceal orifice and                            ileocecal valve. The colonoscopy was performed                            without difficulty. The patient tolerated the                            procedure well. The quality of the bowel                            preparation was adequate. The ileocecal valve,                            appendiceal orifice, and rectum were photographed.                            The bowel preparation used was Miralax via split                            dose instruction. Scope In: 12:24:12 PM Scope Out: 12:44:30 PM Scope Withdrawal Time: 0 hours 14 minutes 36 seconds  Total Procedure Duration: 0 hours 20 minutes 18 seconds  Findings:                 The perianal and digital rectal examinations were                            normal. Pertinent negatives include normal                            sphincter tone and no palpable rectal lesions.  A 10 mm polyp was found in the transverse colon.                            The polyp was flat. The polyp was removed with a                            cold snare. Resection and retrieval were complete.                            Estimated blood loss was minimal.                           A 12 mm polyp was found in the hepatic flexure. The                            polyp was sessile. The polyp was removed with a                            cold snare. Resection and retrieval were complete.                            Estimated blood loss was minimal.                           Many small and large-mouthed diverticula were found                            in the sigmoid colon, descending colon, transverse                            colon and ascending colon. There was no evidence of                            diverticular bleeding.                           The exam was otherwise  normal throughout the                            examined colon.                           The retroflexed view of the distal rectum and anal                            verge was normal and showed no anal or rectal                            abnormalities. Complications:            No immediate complications. Estimated Blood Loss:     Estimated blood loss was minimal. Impression:               - One 10 mm polyp in the transverse colon, removed  with a cold snare. Resected and retrieved.                           - One 12 mm polyp at the hepatic flexure, removed                            with a cold snare. Resected and retrieved.                           - Moderate diverticulosis in the sigmoid colon, in                            the descending colon, in the transverse colon and                            in the ascending colon. There was no evidence of                            diverticular bleeding.                           - The distal rectum and anal verge are normal on                            retroflexion view. Recommendation:           - Patient has a contact number available for                            emergencies. The signs and symptoms of potential                            delayed complications were discussed with the                            patient. Return to normal activities tomorrow.                            Written discharge instructions were provided to the                            patient.                           - Resume previous diet.                           - Continue present medications.                           - Await pathology results.                           - Repeat colonoscopy (date not yet determined) for  surveillance based on pathology results. Kazi Montoro E. Candis Schatz, MD 01/03/2022 12:49:28 PM This report has been signed electronically.

## 2022-01-03 NOTE — Progress Notes (Signed)
Report to PACU, RN, vss, BBS= Clear.  

## 2022-01-03 NOTE — Progress Notes (Signed)
[facility] History and Physical   Primary Care Physician:  Velna Hatchet, MD   Reason for Procedure:   Colon cancer screening, increased risk  Plan:    Screening colonoscopy     HPI: Meagan Stein is a 76 y.o. female undergoing screening colonoscopy.  Her sister was diagnosed with colon cancer in her early 36s.  She has a history of uncomplicated sigmoid diverticulitis but no chronic GI symptoms. She doesn't know when her last colonoscopy was.   Past Medical History:  Diagnosis Date   Atrial flutter ([place])    Diverticulitis    2022,URGENT CARE   Hearing loss    WEAR HEARING AID   Osteoporosis 2005   Tachycardia     Past Surgical History:  Procedure Laterality Date   APPENDECTOMY  04/12/1983   COLONOSCOPY     15 YEARS AGO   COSMETIC SURGERY  04/11/1980   breast implants   TUBAL LIGATION      Prior to Admission medications   Medication Sig Start Date End Date Taking? Authorizing Provider  alendronate (FOSAMAX) 70 MG tablet Take 70 mg by mouth once a week. Take with a full glass of water on an empty stomach.    [provider]  aspirin 81 MG tablet Take 81 mg by mouth daily.    [provider]    Current Outpatient Medications  Medication Sig Dispense Refill   alendronate (FOSAMAX) 70 MG tablet Take 70 mg by mouth once a week. Take with a full glass of water on an empty stomach.     aspirin 81 MG tablet Take 81 mg by mouth daily.     Current Facility-Administered Medications  Medication Dose Route Frequency Provider Last Rate Last Admin   0.9 %  sodium chloride infusion  500 mL Intravenous Once Daryel November, MD        Allergies as of 01/03/2022   (No Known Allergies)    Family History  Problem Relation Age of Onset   Rectal cancer Sister    Colon polyps Sister    Colon cancer Sister 67   Cancer Sister    Cancer Sister    Cancer Brother    Cancer Brother    Crohn's disease Neg Hx    Esophageal cancer Neg Hx     Stomach cancer Neg Hx     Social History   Socioeconomic History   Marital status: Married    Spouse name: Not on file   Number of children: Not on file   Years of education: Not on file   Highest education level: Not on file  Occupational History   Not on file  Tobacco Use   Smoking status: Never    Passive exposure: Current (HUSBAND SMOKER)   Smokeless tobacco: Never  Vaping Use   Vaping Use: Never used  Substance and Sexual Activity   Alcohol use: No   Drug use: No   Sexual activity: Yes    Birth control/protection: None  Other Topics Concern   Not on file  Social History Narrative   Not on file   Social Determinants of Health   Financial Resource Strain: Not on file  Food Insecurity: Not on file  Transportation Needs: Not on file  Physical Activity: Not on file  Stress: Not on file  Social Connections: Not on file  Intimate Partner Violence: Not on file    Review of Systems:  All other review of systems negative except as mentioned in the HPI.  Physical Exam: Vital signs BP (!) 123/56   Pulse 78   Temp 98.8 F (37.1 C)   Ht 5' 4.5" (1.638 m)   Wt 139 lb (63 kg)   SpO2 98%   BMI 23.49 kg/m   General:   Alert,  Well-developed, well-nourished, pleasant and cooperative in NAD Airway:  Mallampati 2 Lungs:  Clear throughout to auscultation.   Heart:  Regular rate and rhythm; no murmurs, clicks, rubs,  or gallops. Abdomen:  Soft, nontender and nondistended. Normal bowel sounds.   Neuro/Psych:  Normal mood and affect. A and O x 3   Meagan Hefel E. Candis Schatz, MD [facility]

## 2022-01-03 NOTE — Progress Notes (Signed)
Called to room to assist during endoscopic procedure.  Patient ID and intended procedure confirmed with present staff. Received instructions for my participation in the procedure from the performing physician.  

## 2022-01-04 ENCOUNTER — Telehealth: Payer: Self-pay | Admitting: *Deleted

## 2022-01-04 NOTE — Telephone Encounter (Signed)
No answer on  follow up call. Left message.   

## 2022-01-05 NOTE — Progress Notes (Signed)
Meagan Stein,  Both polyps that were removed during your recent colonoscopy were considered "precancerous" polyps, meaning that they would have had the potential to turn into cancer over time.  Based on the size of your polyps, current guidelines would suggest repeating a colonoscopy in 3 years.  Colon cancer screening after the age 75 is done on a case-by-case basis.  I would recommend you see [place] in clinic in 3 years to discuss the risks and benefits of ongoing colon cancer screening.

## 2022-02-02 DIAGNOSIS — H524 Presbyopia: Secondary | ICD-10-CM | POA: Diagnosis not present

## 2022-02-02 DIAGNOSIS — H52223 Regular astigmatism, bilateral: Secondary | ICD-10-CM | POA: Diagnosis not present

## 2022-02-02 DIAGNOSIS — H40013 Open angle with borderline findings, low risk, bilateral: Secondary | ICD-10-CM | POA: Diagnosis not present

## 2022-02-02 DIAGNOSIS — H5203 Hypermetropia, bilateral: Secondary | ICD-10-CM | POA: Diagnosis not present

## 2022-02-02 DIAGNOSIS — H25813 Combined forms of age-related cataract, bilateral: Secondary | ICD-10-CM | POA: Diagnosis not present

## 2022-02-02 DIAGNOSIS — Z135 Encounter for screening for eye and ear disorders: Secondary | ICD-10-CM | POA: Diagnosis not present

## 2022-03-22 DIAGNOSIS — Z85820 Personal history of malignant melanoma of skin: Secondary | ICD-10-CM | POA: Diagnosis not present

## 2022-03-22 DIAGNOSIS — L821 Other seborrheic keratosis: Secondary | ICD-10-CM | POA: Diagnosis not present

## 2022-03-22 DIAGNOSIS — L812 Freckles: Secondary | ICD-10-CM | POA: Diagnosis not present

## 2022-03-22 DIAGNOSIS — D1801 Hemangioma of skin and subcutaneous tissue: Secondary | ICD-10-CM | POA: Diagnosis not present

## 2022-03-22 DIAGNOSIS — D2362 Other benign neoplasm of skin of left upper limb, including shoulder: Secondary | ICD-10-CM | POA: Diagnosis not present

## 2022-03-25 ENCOUNTER — Encounter: Payer: Self-pay | Admitting: Cardiology

## 2022-03-25 ENCOUNTER — Ambulatory Visit: Payer: Medicare HMO | Attending: Cardiology | Admitting: Cardiology

## 2022-03-25 VITALS — BP 122/60 | HR 72 | Ht 66.0 in | Wt 144.5 lb

## 2022-03-25 DIAGNOSIS — H9193 Unspecified hearing loss, bilateral: Secondary | ICD-10-CM | POA: Diagnosis not present

## 2022-03-25 DIAGNOSIS — I4892 Unspecified atrial flutter: Secondary | ICD-10-CM

## 2022-03-25 NOTE — Progress Notes (Signed)
Cardiology Office Note:    Date:  03/25/2022   ID:  Meagan Stein Sep 23, 1945, MRN [id number]  PCP:  Velna Hatchet, MD   [facility] Providers Cardiologist:  Candee Furbish, MD     Referring MD: Velna Hatchet, MD   History of Present Illness:    Meagan Stein is a 76 y.o. female here for the evaluation of palpitations, recent ED visit, SVT, possible atrial flutter.  On 11/05/2020 she presented to the ED complaining of lower abdominal pain for the previous 2-3 days and difficulty passing urine. She was found to have a high heart rate, possibly atrial flutter but she denied feeling any palpitations. CT scan showed uncomplicated diverticulitis.  Today: Overall, she appears well, and has recovered well. She felt a little lightheaded, but did not feel any palpitations.  She reports that she went to urgent care, and was found to have elevated heart rate. She was then sent to the ED and told to see a cardiologist. Of note, her pain and diverticulitis have not recurred.  Feels faint rarely. HR is up. 86-92 bpm. BP 96/56.  Hydrate well encouraged.  No chest pain.  She will rarely have right-sided jaw pain.  She has had a cavity detected as well.  She was worried that this may be a heart attack segment.  We discussed.  She is not a smoker.  She has 2 siblings who died of cancer, and 1 brother with cancer. She endorses a hx of skin cancer on her right UE.   Past Medical History:  Diagnosis Date   Atrial flutter ([place])    Diverticulitis    2022,URGENT CARE   Hearing loss    WEAR HEARING AID   Osteoporosis 2005   Tachycardia     Past Surgical History:  Procedure Laterality Date   APPENDECTOMY  04/12/1983   COLONOSCOPY     15 YEARS AGO   COSMETIC SURGERY  04/11/1980   breast implants   TUBAL LIGATION      Current Medications: Current Meds  Medication Sig   alendronate (FOSAMAX) 70 MG tablet Take 70 mg by mouth once a week. Take with a full glass of water on an empty  stomach.   aspirin 81 MG tablet Take 81 mg by mouth daily.     Allergies:   Patient has no known allergies.   Social History   Socioeconomic History   Marital status: Married    Spouse name: Not on file   Number of children: Not on file   Years of education: Not on file   Highest education level: Not on file  Occupational History   Not on file  Tobacco Use   Smoking status: Never    Passive exposure: Current (HUSBAND SMOKER)   Smokeless tobacco: Never  Vaping Use   Vaping Use: Never used  Substance and Sexual Activity   Alcohol use: No   Drug use: No   Sexual activity: Yes    Birth control/protection: None  Other Topics Concern   Not on file  Social History Narrative   Not on file   Social Determinants of Health   Financial Resource Strain: Not on file  Food Insecurity: Not on file  Transportation Needs: Not on file  Physical Activity: Not on file  Stress: Not on file  Social Connections: Not on file     Family History: The patient's family history includes Cancer in her brother, brother, sister, and sister; Colon cancer (age of onset: 20) in  her sister; Colon polyps in her sister; Rectal cancer in her sister. There is no history of Crohn's disease, Esophageal cancer, or Stomach cancer.  ROS:   Please see the history of present illness.    All other systems reviewed and are negative.  EKGs/Labs/Other Studies Reviewed:    The following studies were reviewed today: No prior cardiovascular studies available.   EKG:  EKG is personally reviewed and interpreted. 01/12/2021: Sinus rhythm. Rate 80 bpm. 11/05/2020 (ED): Supraventricular tachycardia. Rate 147 bpm. Nonspecific ST abnormality.  Recent Labs: No results found for requested labs within last 365 days.   Recent Lipid Panel No results found for: "CHOL", "TRIG", "HDL", "CHOLHDL", "VLDL", "LDLCALC", "LDLDIRECT"   Risk Assessment/Calculations:          Physical Exam:    VS:  BP 122/60 (BP Location:  Left Arm, Patient Position: Sitting, Cuff Size: Normal)   Pulse 72   Ht '5\' 6"'$  (1.676 m)   Wt 144 lb 8 oz (65.5 kg)   SpO2 99%   BMI 23.32 kg/m     Wt Readings from Last 3 Encounters:  03/25/22 144 lb 8 oz (65.5 kg)  01/03/22 139 lb (63 kg)  12/16/21 139 lb (63 kg)     GEN: Well nourished, well developed in no acute distress HEENT: Normal NECK: No JVD; No carotid bruits LYMPHATICS: No lymphadenopathy CARDIAC: RRR, no murmurs, rubs, gallops RESPIRATORY:  Clear to auscultation without rales, wheezing or rhonchi  ABDOMEN: Soft, non-tender, non-distended MUSCULOSKELETAL:  No edema; No deformity  SKIN: Warm and dry NEUROLOGIC:  Alert and oriented x 3 PSYCHIATRIC:  Normal affect   ASSESSMENT:    1. Atrial flutter, unspecified type ([place])   2. Bilateral hearing loss, unspecified hearing loss type     PLAN:    In order of problems listed above:   Atrial flutter ([facility]) Paroxysmal atrial flutter, atypical pattern noted on ECG during her episode of acute diverticulitis.  Thankfully, she auto converted and we have not seen any further episodes.  This episode was likely triggered by her underlying infection.  I did relate to her the risks of atrial flutter which are akin to atrial fibrillation, stroke.  If this were to return, anticoagulation would be warranted.  For now, I will check an echocardiogram to ensure proper structure and function of her heart. Okay for her to continue with 81 mg of aspirin.  She will let us know if tachycardia returns.  Hearing loss Challenging for her to hear conversations.  Mild hypotension/dizziness Likely hypotension especially in the early morning hours.  Asked her to hydrate more.   Follow-up: 1 year.  Medication Adjustments/Labs and Tests Ordered: Current medicines are reviewed at length with the patient today.  Concerns regarding medicines are outlined above.  Orders Placed This Encounter  Procedures   EKG 12-Lead     No orders of the  defined types were placed in this encounter.    Patient Instructions  Medication Instructions:  The current medical regimen is effective;  continue present plan and medications.  *If you need a refill on your cardiac medications before your next appointment, please call your pharmacy*  Follow-Up: At [facility], you and your health needs are our priority.  As part of our continuing mission to provide you with exceptional heart care, we have created designated Provider Care Teams.  These Care Teams include your primary Cardiologist (physician) and Advanced Practice Providers (APPs -  Physician Assistants and Nurse Practitioners) who all work together to  provide you with the care you need, when you need it.  We recommend signing up for the patient portal called "MyChart".  Sign up information is provided on this After Visit Summary.  MyChart is used to connect with patients for Virtual Visits (Telemedicine).  Patients are able to view lab/test results, encounter notes, upcoming appointments, etc.  Non-urgent messages can be sent to your provider as well.   To learn more about what you can do with MyChart, go to [email or web address].    Your next appointment:   1 year(s)  The format for your next appointment:   In Person  Provider:   Dr Candee Furbish      Important Information About Sugar          Signed, Candee Furbish, MD  03/25/2022 4:22 PM    [place]

## 2022-03-25 NOTE — Patient Instructions (Signed)
Medication Instructions:  The current medical regimen is effective;  continue present plan and medications.  *If you need a refill on your cardiac medications before your next appointment, please call your pharmacy*  Follow-Up: At [facility], you and your health needs are our priority.  As part of our continuing mission to provide you with exceptional heart care, we have created designated Provider Care Teams.  These Care Teams include your primary Cardiologist (physician) and Advanced Practice Providers (APPs -  Physician Assistants and Nurse Practitioners) who all work together to provide you with the care you need, when you need it.  We recommend signing up for the patient portal called "MyChart".  Sign up information is provided on this After Visit Summary.  MyChart is used to connect with patients for Virtual Visits (Telemedicine).  Patients are able to view lab/test results, encounter notes, upcoming appointments, etc.  Non-urgent messages can be sent to your provider as well.   To learn more about what you can do with MyChart, go to [email or web address].    Your next appointment:   1 year(s)  The format for your next appointment:   In Person  Provider:   Dr Mark Skains  Important Information About Sugar       

## 2022-05-28 DIAGNOSIS — R69 Illness, unspecified: Secondary | ICD-10-CM | POA: Diagnosis not present

## 2022-06-16 DIAGNOSIS — R69 Illness, unspecified: Secondary | ICD-10-CM | POA: Diagnosis not present

## 2022-08-01 DIAGNOSIS — R1032 Left lower quadrant pain: Secondary | ICD-10-CM | POA: Diagnosis not present

## 2022-08-01 DIAGNOSIS — K5792 Diverticulitis of intestine, part unspecified, without perforation or abscess without bleeding: Secondary | ICD-10-CM | POA: Diagnosis not present

## 2022-08-19 DIAGNOSIS — H40013 Open angle with borderline findings, low risk, bilateral: Secondary | ICD-10-CM | POA: Diagnosis not present

## 2022-09-21 DIAGNOSIS — L821 Other seborrheic keratosis: Secondary | ICD-10-CM | POA: Diagnosis not present

## 2022-09-21 DIAGNOSIS — D225 Melanocytic nevi of trunk: Secondary | ICD-10-CM | POA: Diagnosis not present

## 2022-09-21 DIAGNOSIS — Z85820 Personal history of malignant melanoma of skin: Secondary | ICD-10-CM | POA: Diagnosis not present

## 2022-09-21 DIAGNOSIS — D2362 Other benign neoplasm of skin of left upper limb, including shoulder: Secondary | ICD-10-CM | POA: Diagnosis not present

## 2022-09-21 DIAGNOSIS — L812 Freckles: Secondary | ICD-10-CM | POA: Diagnosis not present

## 2022-09-21 DIAGNOSIS — D1801 Hemangioma of skin and subcutaneous tissue: Secondary | ICD-10-CM | POA: Diagnosis not present

## 2022-11-02 DIAGNOSIS — R7989 Other specified abnormal findings of blood chemistry: Secondary | ICD-10-CM | POA: Diagnosis not present

## 2022-11-02 DIAGNOSIS — M810 Age-related osteoporosis without current pathological fracture: Secondary | ICD-10-CM | POA: Diagnosis not present

## 2022-11-02 DIAGNOSIS — Z0000 Encounter for general adult medical examination without abnormal findings: Secondary | ICD-10-CM | POA: Diagnosis not present

## 2022-11-02 DIAGNOSIS — E7849 Other hyperlipidemia: Secondary | ICD-10-CM | POA: Diagnosis not present

## 2022-11-09 DIAGNOSIS — H9190 Unspecified hearing loss, unspecified ear: Secondary | ICD-10-CM | POA: Diagnosis not present

## 2022-11-09 DIAGNOSIS — L989 Disorder of the skin and subcutaneous tissue, unspecified: Secondary | ICD-10-CM | POA: Diagnosis not present

## 2022-11-09 DIAGNOSIS — Z0000 Encounter for general adult medical examination without abnormal findings: Secondary | ICD-10-CM | POA: Diagnosis not present

## 2022-11-09 DIAGNOSIS — M810 Age-related osteoporosis without current pathological fracture: Secondary | ICD-10-CM | POA: Diagnosis not present

## 2022-11-09 DIAGNOSIS — I4892 Unspecified atrial flutter: Secondary | ICD-10-CM | POA: Diagnosis not present

## 2022-11-09 DIAGNOSIS — Z1331 Encounter for screening for depression: Secondary | ICD-10-CM | POA: Diagnosis not present

## 2022-11-09 DIAGNOSIS — E559 Vitamin D deficiency, unspecified: Secondary | ICD-10-CM | POA: Diagnosis not present

## 2022-11-09 DIAGNOSIS — R82998 Other abnormal findings in urine: Secondary | ICD-10-CM | POA: Diagnosis not present

## 2022-11-09 DIAGNOSIS — Z1339 Encounter for screening examination for other mental health and behavioral disorders: Secondary | ICD-10-CM | POA: Diagnosis not present

## 2022-11-09 DIAGNOSIS — K5792 Diverticulitis of intestine, part unspecified, without perforation or abscess without bleeding: Secondary | ICD-10-CM | POA: Diagnosis not present

## 2022-11-09 DIAGNOSIS — L309 Dermatitis, unspecified: Secondary | ICD-10-CM | POA: Diagnosis not present

## 2022-12-23 DIAGNOSIS — M8588 Other specified disorders of bone density and structure, other site: Secondary | ICD-10-CM | POA: Diagnosis not present

## 2022-12-23 DIAGNOSIS — Z1231 Encounter for screening mammogram for malignant neoplasm of breast: Secondary | ICD-10-CM | POA: Diagnosis not present

## 2022-12-23 DIAGNOSIS — R29890 Loss of height: Secondary | ICD-10-CM | POA: Diagnosis not present

## 2023-03-27 DIAGNOSIS — L304 Erythema intertrigo: Secondary | ICD-10-CM | POA: Diagnosis not present

## 2023-03-27 DIAGNOSIS — L812 Freckles: Secondary | ICD-10-CM | POA: Diagnosis not present

## 2023-03-27 DIAGNOSIS — L218 Other seborrheic dermatitis: Secondary | ICD-10-CM | POA: Diagnosis not present

## 2023-03-27 DIAGNOSIS — D1801 Hemangioma of skin and subcutaneous tissue: Secondary | ICD-10-CM | POA: Diagnosis not present

## 2023-03-27 DIAGNOSIS — L821 Other seborrheic keratosis: Secondary | ICD-10-CM | POA: Diagnosis not present

## 2023-05-09 DIAGNOSIS — G479 Sleep disorder, unspecified: Secondary | ICD-10-CM | POA: Diagnosis not present

## 2023-05-09 DIAGNOSIS — F4321 Adjustment disorder with depressed mood: Secondary | ICD-10-CM | POA: Diagnosis not present

## 2023-05-09 DIAGNOSIS — M810 Age-related osteoporosis without current pathological fracture: Secondary | ICD-10-CM | POA: Diagnosis not present

## 2023-05-17 IMAGING — CT CT ABD-PELV W/ CM
2 of 5 series · 17 of 46 positions shown, 19 images · IV contrast (APPLIED)
Comparison: None.

CLINICAL DATA: Lower abdominal pain.

EXAM:
CT ABDOMEN AND PELVIS WITH CONTRAST
TECHNIQUE: Multidetector CT imaging of the abdomen and pelvis was performed
using the standard protocol following bolus administration of
intravenous contrast.
CONTRAST:  75mL OMNIPAQUE IOHEXOL 300 MG/ML  SOLN

[Series 2: abd pel w · axial · 0.75mm/px · z∈[+1033,+1398]mm · 14 of 83 slices shown, 16 images]
[im 5/83  soft-tissue]
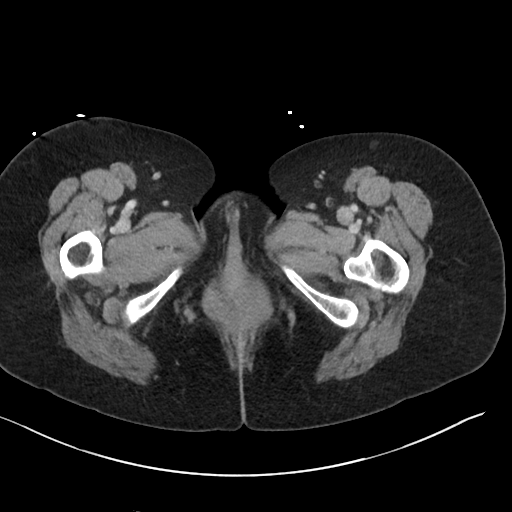
[im 5/83  bone]
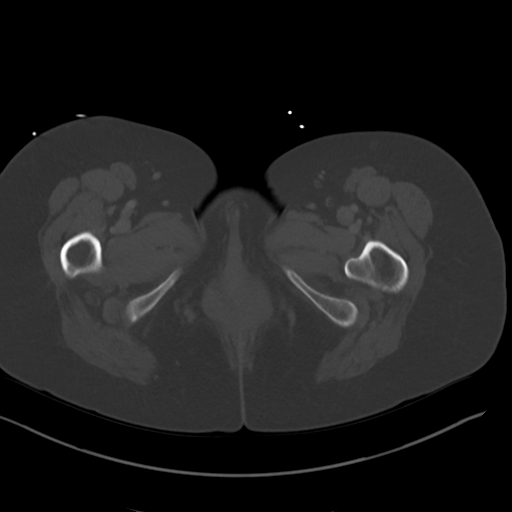
[im 13/83  soft-tissue]
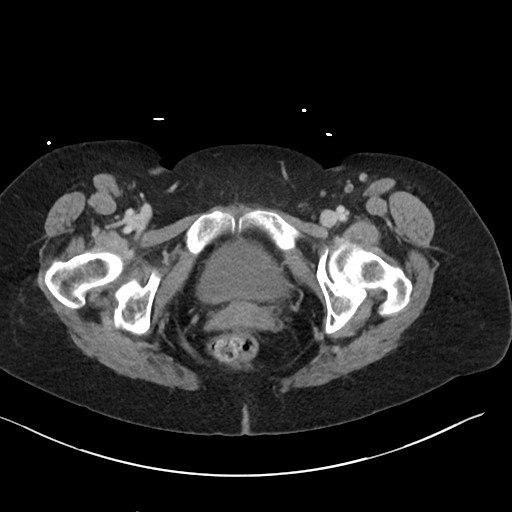
[im 17/83  soft-tissue]
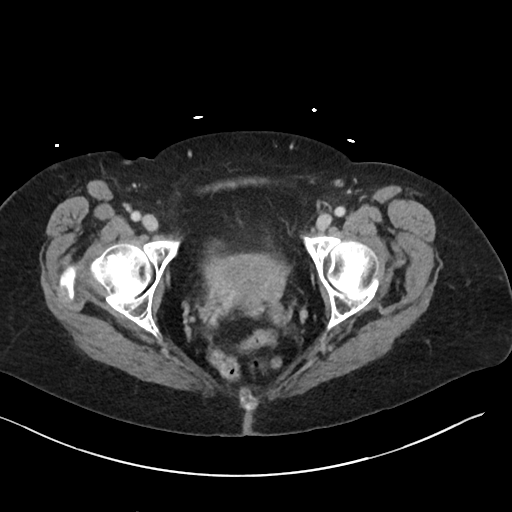
[im 21/83  soft-tissue]
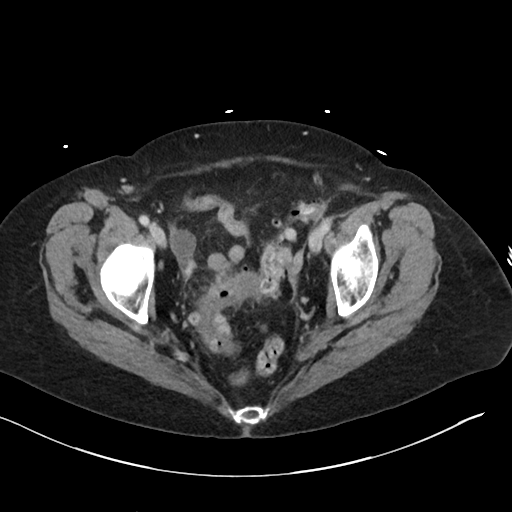
[im 29/83  soft-tissue]
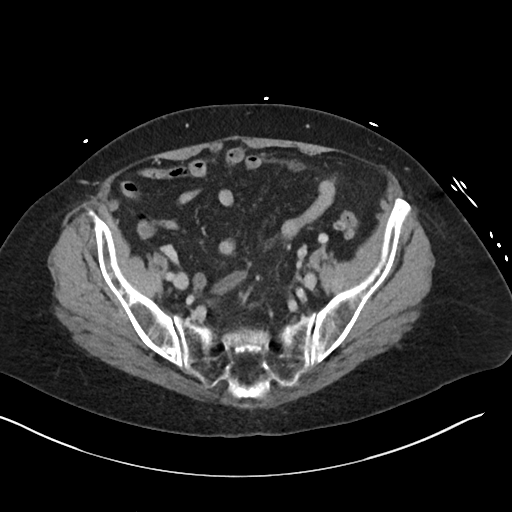
[im 33/83  soft-tissue]
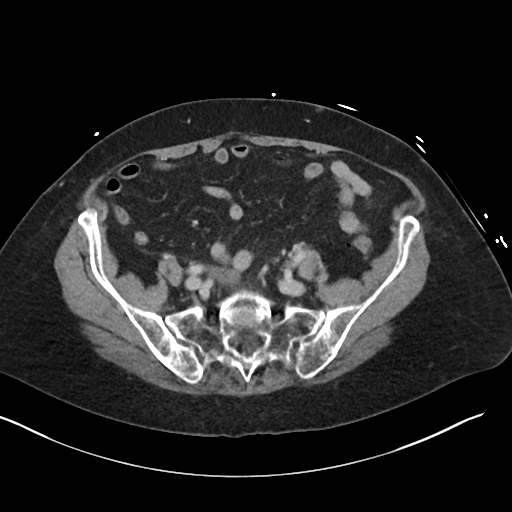
[im 37/83  soft-tissue]
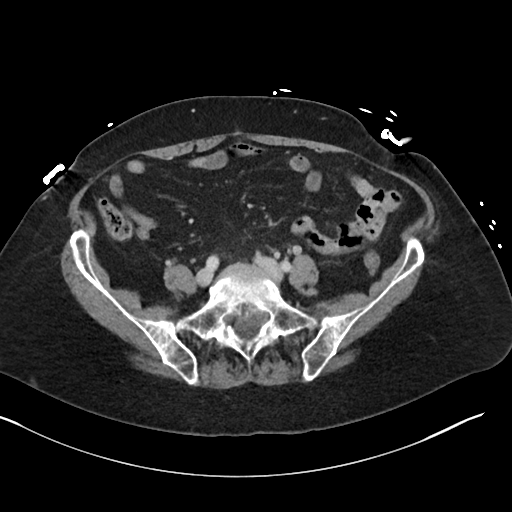
[im 46/83  soft-tissue]
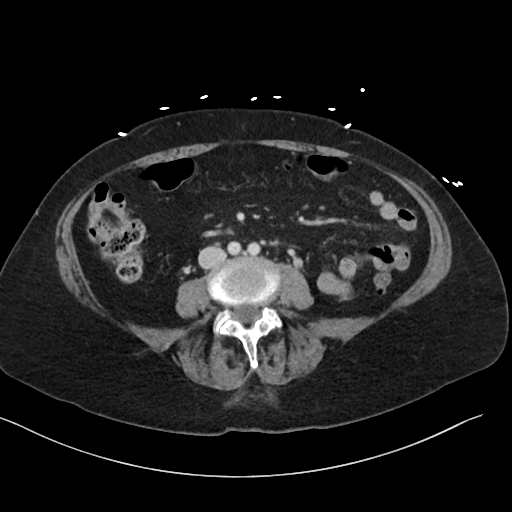
[im 50/83  soft-tissue]
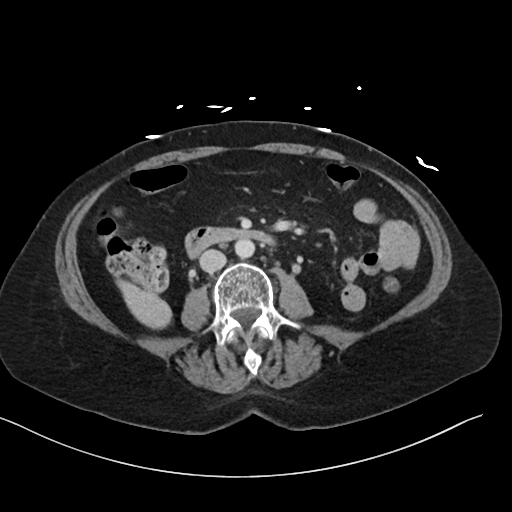
[im 50/83  bone]
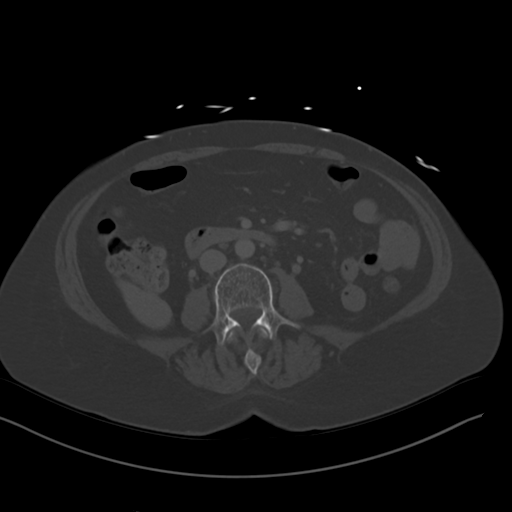
[im 54/83  soft-tissue]
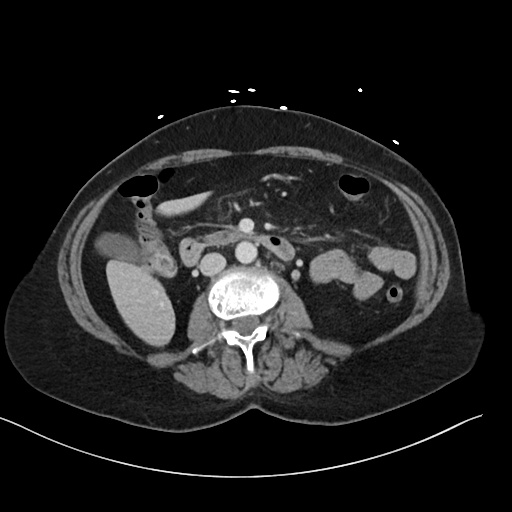
[im 62/83  soft-tissue]
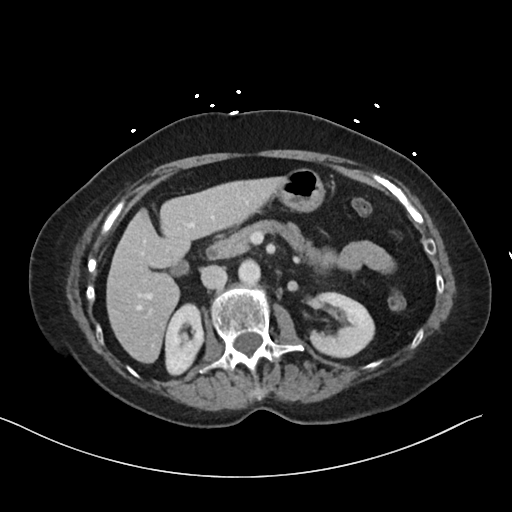
[im 66/83  soft-tissue]
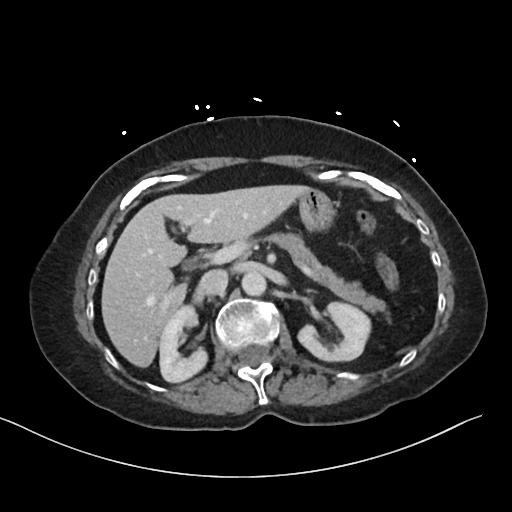
[im 70/83  soft-tissue]
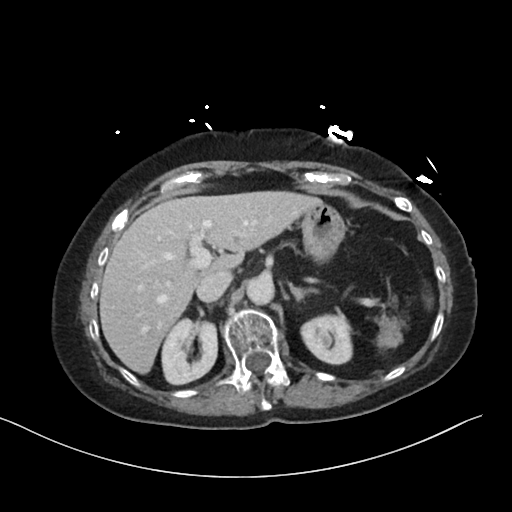
[im 78/83  soft-tissue]
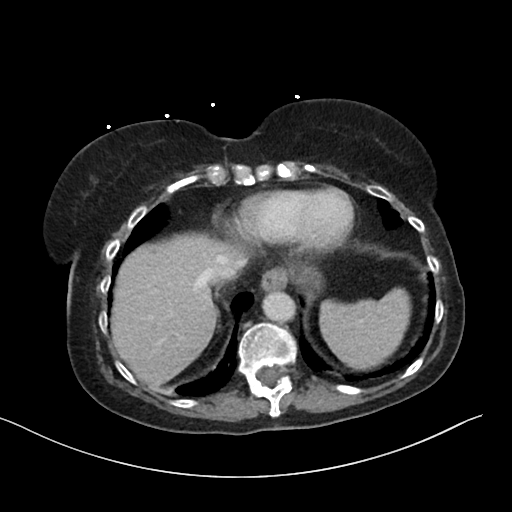

[Series 5: coronal · coronal · 0.72mm/px · 3 of 89 slices shown]
[im 30/89  soft-tissue]
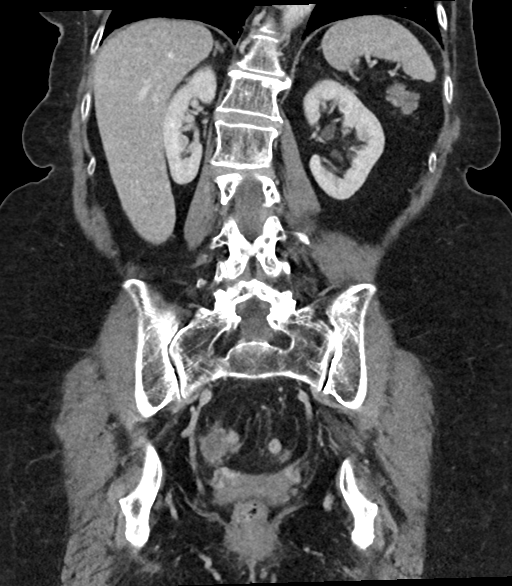
[im 40/89  soft-tissue]
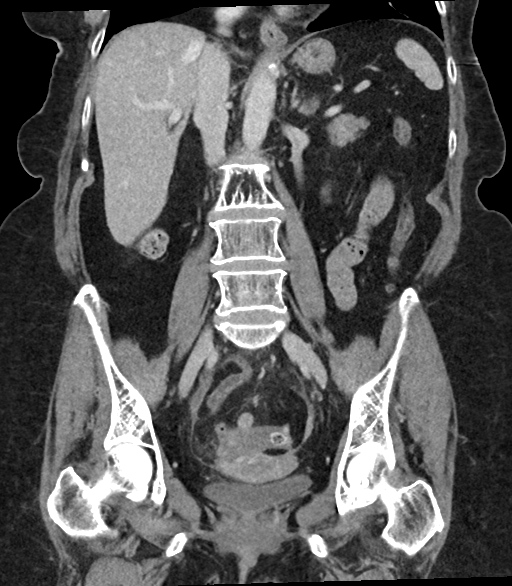
[im 49/89  soft-tissue]
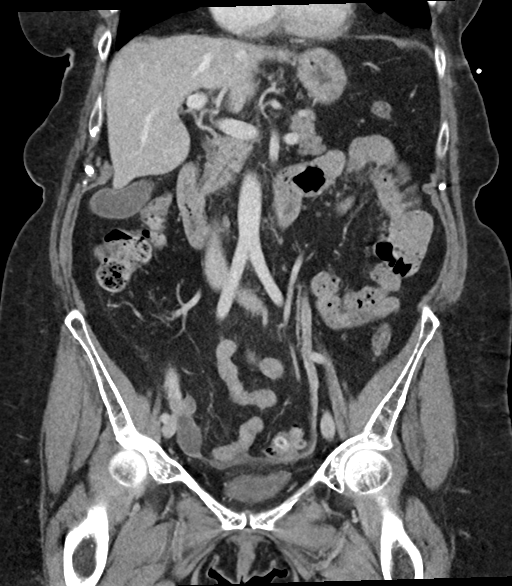

[17 of 46 positions shown; findings below may reference images not displayed]

FINDINGS: Lower chest: No acute abnormality.

Hepatobiliary: No focal liver abnormality is seen. No gallstones,
gallbladder wall thickening, or biliary dilatation.

Pancreas: Unremarkable. No pancreatic ductal dilatation or
surrounding inflammatory changes.

Spleen: Normal in size without focal abnormality.

Adrenals/Urinary Tract: Adrenal glands are unremarkable. Kidneys are
normal, without renal calculi, focal lesion, or hydronephrosis.
Bladder is unremarkable.

Stomach/Bowel: The stomach appears normal. There is no evidence of
bowel obstruction. Mild sigmoid diverticulitis is noted. No definite
abscess formation is noted.

Vascular/Lymphatic: No significant vascular findings are present. No
enlarged abdominal or pelvic lymph nodes.

Reproductive: Uterus and bilateral adnexa are unremarkable.

Other: Small fat containing periumbilical hernia is noted. No
ascites is noted.

Musculoskeletal: No acute or significant osseous findings.
IMPRESSION: Mild sigmoid diverticulitis is noted.

## 2023-06-06 DIAGNOSIS — G479 Sleep disorder, unspecified: Secondary | ICD-10-CM | POA: Diagnosis not present

## 2023-06-06 DIAGNOSIS — F4321 Adjustment disorder with depressed mood: Secondary | ICD-10-CM | POA: Diagnosis not present

## 2023-09-25 DIAGNOSIS — Z85820 Personal history of malignant melanoma of skin: Secondary | ICD-10-CM | POA: Diagnosis not present

## 2023-09-25 DIAGNOSIS — D485 Neoplasm of uncertain behavior of skin: Secondary | ICD-10-CM | POA: Diagnosis not present

## 2023-09-25 DIAGNOSIS — L812 Freckles: Secondary | ICD-10-CM | POA: Diagnosis not present

## 2023-09-25 DIAGNOSIS — D1801 Hemangioma of skin and subcutaneous tissue: Secondary | ICD-10-CM | POA: Diagnosis not present

## 2023-09-25 DIAGNOSIS — D2362 Other benign neoplasm of skin of left upper limb, including shoulder: Secondary | ICD-10-CM | POA: Diagnosis not present

## 2023-09-25 DIAGNOSIS — L820 Inflamed seborrheic keratosis: Secondary | ICD-10-CM | POA: Diagnosis not present

## 2023-09-25 DIAGNOSIS — D225 Melanocytic nevi of trunk: Secondary | ICD-10-CM | POA: Diagnosis not present

## 2023-09-25 DIAGNOSIS — L821 Other seborrheic keratosis: Secondary | ICD-10-CM | POA: Diagnosis not present

## 2023-11-21 DIAGNOSIS — R7989 Other specified abnormal findings of blood chemistry: Secondary | ICD-10-CM | POA: Diagnosis not present

## 2023-11-21 DIAGNOSIS — E559 Vitamin D deficiency, unspecified: Secondary | ICD-10-CM | POA: Diagnosis not present

## 2023-11-21 DIAGNOSIS — M810 Age-related osteoporosis without current pathological fracture: Secondary | ICD-10-CM | POA: Diagnosis not present

## 2023-11-21 DIAGNOSIS — Z79899 Other long term (current) drug therapy: Secondary | ICD-10-CM | POA: Diagnosis not present

## 2023-11-28 DIAGNOSIS — K5792 Diverticulitis of intestine, part unspecified, without perforation or abscess without bleeding: Secondary | ICD-10-CM | POA: Diagnosis not present

## 2023-11-28 DIAGNOSIS — L989 Disorder of the skin and subcutaneous tissue, unspecified: Secondary | ICD-10-CM | POA: Diagnosis not present

## 2023-11-28 DIAGNOSIS — F4321 Adjustment disorder with depressed mood: Secondary | ICD-10-CM | POA: Diagnosis not present

## 2023-11-28 DIAGNOSIS — I4892 Unspecified atrial flutter: Secondary | ICD-10-CM | POA: Diagnosis not present

## 2023-11-28 DIAGNOSIS — G4700 Insomnia, unspecified: Secondary | ICD-10-CM | POA: Diagnosis not present

## 2023-11-28 DIAGNOSIS — M810 Age-related osteoporosis without current pathological fracture: Secondary | ICD-10-CM | POA: Diagnosis not present

## 2023-11-28 DIAGNOSIS — Z0000 Encounter for general adult medical examination without abnormal findings: Secondary | ICD-10-CM | POA: Diagnosis not present

## 2023-11-28 DIAGNOSIS — Z1339 Encounter for screening examination for other mental health and behavioral disorders: Secondary | ICD-10-CM | POA: Diagnosis not present

## 2023-11-28 DIAGNOSIS — Z1331 Encounter for screening for depression: Secondary | ICD-10-CM | POA: Diagnosis not present

## 2023-11-28 DIAGNOSIS — L309 Dermatitis, unspecified: Secondary | ICD-10-CM | POA: Diagnosis not present

## 2023-11-28 DIAGNOSIS — E559 Vitamin D deficiency, unspecified: Secondary | ICD-10-CM | POA: Diagnosis not present

## 2023-11-28 DIAGNOSIS — H9190 Unspecified hearing loss, unspecified ear: Secondary | ICD-10-CM | POA: Diagnosis not present

## 2023-12-29 DIAGNOSIS — Z1231 Encounter for screening mammogram for malignant neoplasm of breast: Secondary | ICD-10-CM | POA: Diagnosis not present

## 2024-03-27 DIAGNOSIS — D1801 Hemangioma of skin and subcutaneous tissue: Secondary | ICD-10-CM | POA: Diagnosis not present

## 2024-03-27 DIAGNOSIS — D225 Melanocytic nevi of trunk: Secondary | ICD-10-CM | POA: Diagnosis not present

## 2024-03-27 DIAGNOSIS — L820 Inflamed seborrheic keratosis: Secondary | ICD-10-CM | POA: Diagnosis not present

## 2024-03-27 DIAGNOSIS — Z85820 Personal history of malignant melanoma of skin: Secondary | ICD-10-CM | POA: Diagnosis not present

## 2024-03-27 DIAGNOSIS — L821 Other seborrheic keratosis: Secondary | ICD-10-CM | POA: Diagnosis not present

## 2024-03-27 DIAGNOSIS — D2362 Other benign neoplasm of skin of left upper limb, including shoulder: Secondary | ICD-10-CM | POA: Diagnosis not present

## 2024-03-27 DIAGNOSIS — L812 Freckles: Secondary | ICD-10-CM | POA: Diagnosis not present
